# Patient Record
Sex: Female | Born: 1975
Health system: Southern US, Community
[De-identification: ages and names within clinical notes are randomized; demographics above are authoritative.]

---

## 2002-01-23 ENCOUNTER — Emergency Department (HOSPITAL_COMMUNITY): Admission: EM | Admit: 2002-01-23 | Discharge: 2002-01-23 | Payer: Self-pay | Admitting: Emergency Medicine

## 2002-04-29 ENCOUNTER — Other Ambulatory Visit: Admission: RE | Admit: 2002-04-29 | Discharge: 2002-04-29 | Payer: Self-pay | Admitting: *Deleted

## 2002-04-29 ENCOUNTER — Other Ambulatory Visit: Admission: RE | Admit: 2002-04-29 | Discharge: 2002-04-29 | Payer: Self-pay | Admitting: Ophthalmology

## 2002-09-18 ENCOUNTER — Inpatient Hospital Stay (HOSPITAL_COMMUNITY): Admission: AD | Admit: 2002-09-18 | Discharge: 2002-09-22 | Payer: Self-pay | Admitting: *Deleted

## 2002-11-04 ENCOUNTER — Other Ambulatory Visit: Admission: RE | Admit: 2002-11-04 | Discharge: 2002-11-04 | Payer: Self-pay | Admitting: *Deleted

## 2007-03-31 ENCOUNTER — Other Ambulatory Visit: Admission: RE | Admit: 2007-03-31 | Discharge: 2007-03-31 | Payer: Self-pay | Admitting: Family Medicine

## 2010-01-02 ENCOUNTER — Inpatient Hospital Stay (HOSPITAL_COMMUNITY): Admission: AD | Admit: 2010-01-02 | Discharge: 2010-01-05 | Payer: Self-pay | Admitting: Obstetrics and Gynecology

## 2011-01-06 LAB — CBC
HCT: 28.3 % — ABNORMAL LOW (ref 36.0–46.0)
HCT: 35.8 % — ABNORMAL LOW (ref 36.0–46.0)
Hemoglobin: 11.1 g/dL — ABNORMAL LOW (ref 12.0–15.0)
Hemoglobin: 9.1 g/dL — ABNORMAL LOW (ref 12.0–15.0)
MCHC: 31 g/dL (ref 30.0–36.0)
MCHC: 32.1 g/dL (ref 30.0–36.0)
MCV: 71.5 fL — ABNORMAL LOW (ref 78.0–100.0)
MCV: 72.4 fL — ABNORMAL LOW (ref 78.0–100.0)
Platelets: 224 10*3/uL (ref 150–400)
Platelets: 262 10*3/uL (ref 150–400)
RBC: 3.91 MIL/uL (ref 3.87–5.11)
RBC: 5.01 MIL/uL (ref 3.87–5.11)
RDW: 15.7 % — ABNORMAL HIGH (ref 11.5–15.5)
RDW: 15.8 % — ABNORMAL HIGH (ref 11.5–15.5)
WBC: 13.2 10*3/uL — ABNORMAL HIGH (ref 4.0–10.5)
WBC: 8.6 10*3/uL (ref 4.0–10.5)

## 2011-01-06 LAB — RPR: RPR Ser Ql: NONREACTIVE

## 2011-02-12 ENCOUNTER — Inpatient Hospital Stay (HOSPITAL_COMMUNITY): Payer: Self-pay

## 2011-02-12 ENCOUNTER — Inpatient Hospital Stay (HOSPITAL_COMMUNITY)
Admission: AD | Admit: 2011-02-12 | Discharge: 2011-02-12 | Disposition: A | Discharge status: Home / Self Care | Payer: Self-pay | Source: Ambulatory Visit | Attending: Obstetrics and Gynecology | Admitting: Obstetrics and Gynecology

## 2011-02-12 ENCOUNTER — Other Ambulatory Visit: Payer: Self-pay | Admitting: Obstetrics and Gynecology

## 2011-02-12 DIAGNOSIS — O209 Hemorrhage in early pregnancy, unspecified: Secondary | ICD-10-CM | POA: Insufficient documentation

## 2011-02-12 LAB — URINALYSIS, ROUTINE W REFLEX MICROSCOPIC
Bilirubin Urine: NEGATIVE
Glucose, UA: NEGATIVE mg/dL
Ketones, ur: NEGATIVE mg/dL
Leukocytes, UA: NEGATIVE
Nitrite: NEGATIVE
Protein, ur: NEGATIVE mg/dL
Specific Gravity, Urine: 1.02 (ref 1.005–1.030)
Urobilinogen, UA: 0.2 mg/dL (ref 0.0–1.0)
pH: 7.5 (ref 5.0–8.0)

## 2011-02-12 LAB — URINE MICROSCOPIC-ADD ON

## 2011-02-12 LAB — CBC
HCT: 34.5 % — ABNORMAL LOW (ref 36.0–46.0)
Hemoglobin: 10.9 g/dL — ABNORMAL LOW (ref 12.0–15.0)
MCH: 20.6 pg — ABNORMAL LOW (ref 26.0–34.0)
MCHC: 31.6 g/dL (ref 30.0–36.0)
MCV: 65.2 fL — ABNORMAL LOW (ref 78.0–100.0)
Platelets: 322 10*3/uL (ref 150–400)
RBC: 5.29 MIL/uL — ABNORMAL HIGH (ref 3.87–5.11)
RDW: 15.1 % (ref 11.5–15.5)
WBC: 10.4 10*3/uL (ref 4.0–10.5)

## 2011-02-12 LAB — HCG, QUANTITATIVE, PREGNANCY: hCG, Beta Chain, Quant, S: 1528 m[IU]/mL — ABNORMAL HIGH (ref <5)

## 2011-02-12 LAB — ABO/RH: ABO/RH(D): O POS

## 2011-02-12 IMAGING — US US OB COMP LESS 14 WK
1 series · 14 of 28 positions shown · non-contrast
Comparison: none

[Series 1: us ob comp less 14 wks · 61 acquisitions, 14 frames shown]
[im 3/61]
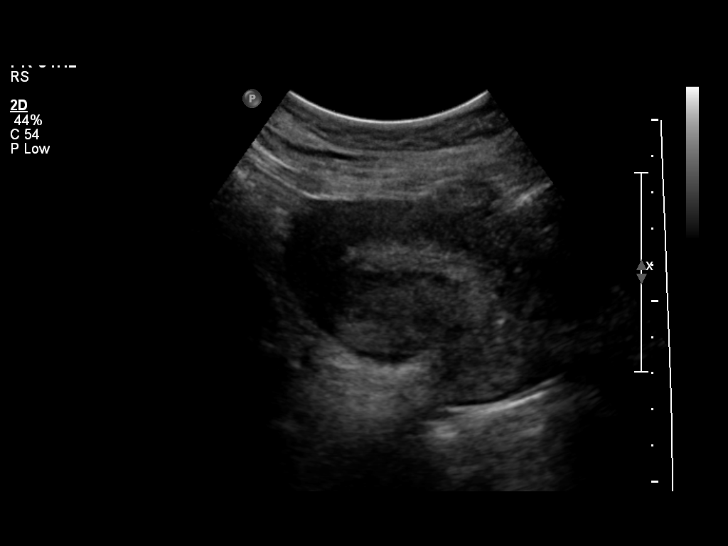
[im 7/61]
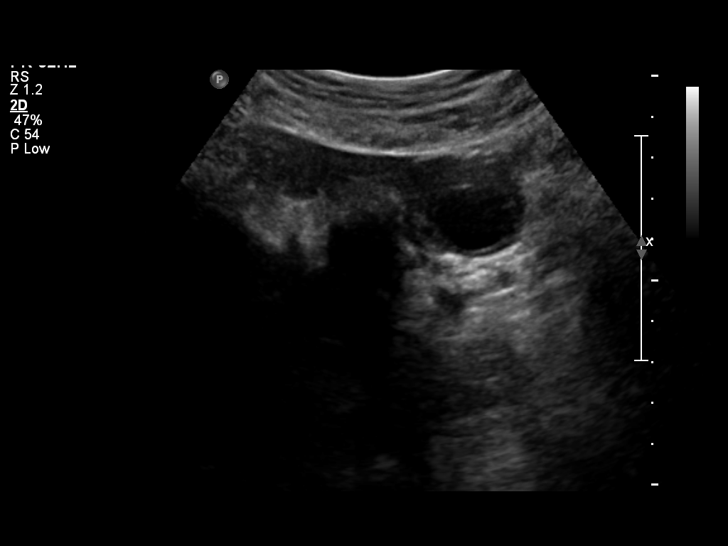
[im 12/61]
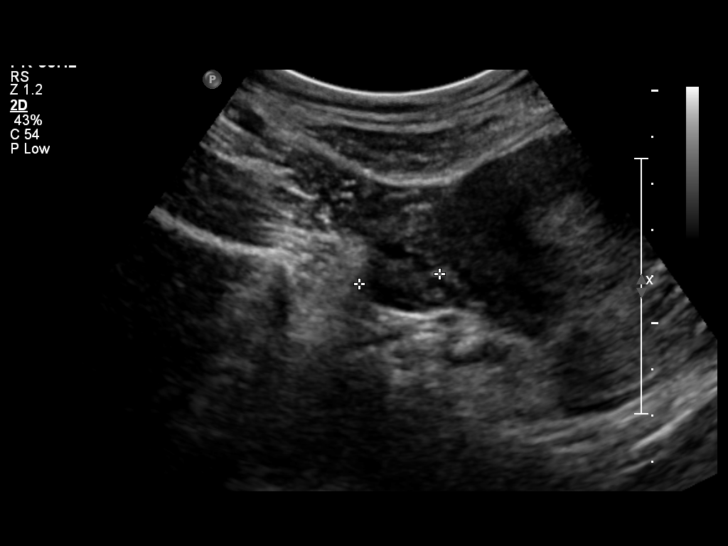
[im 16/61]
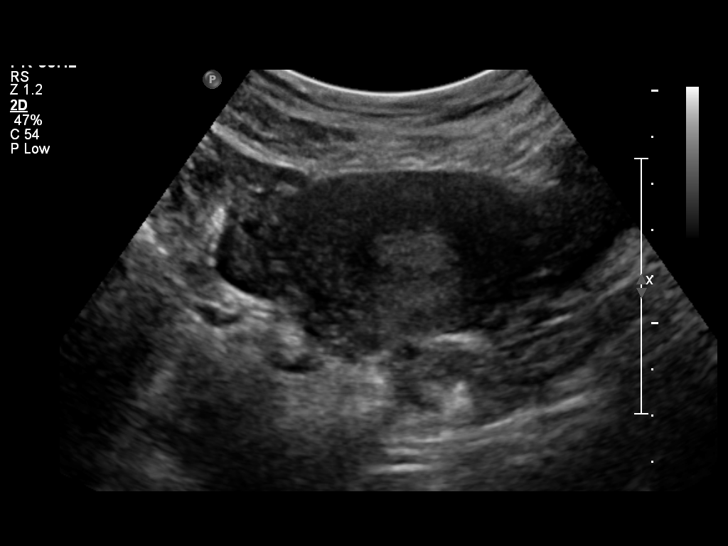
[im 21/61]
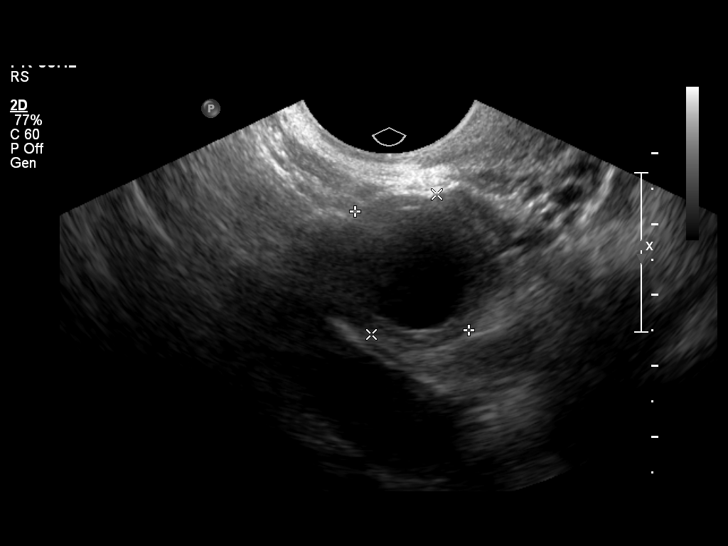
[im 25/61]
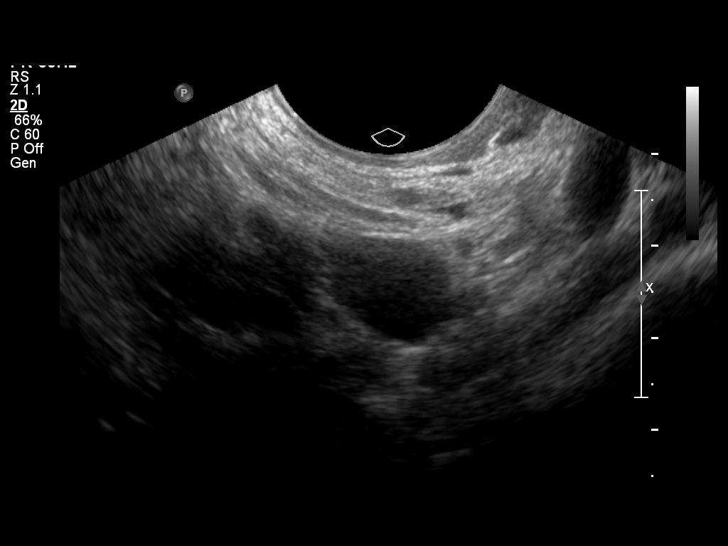
[im 29/61]
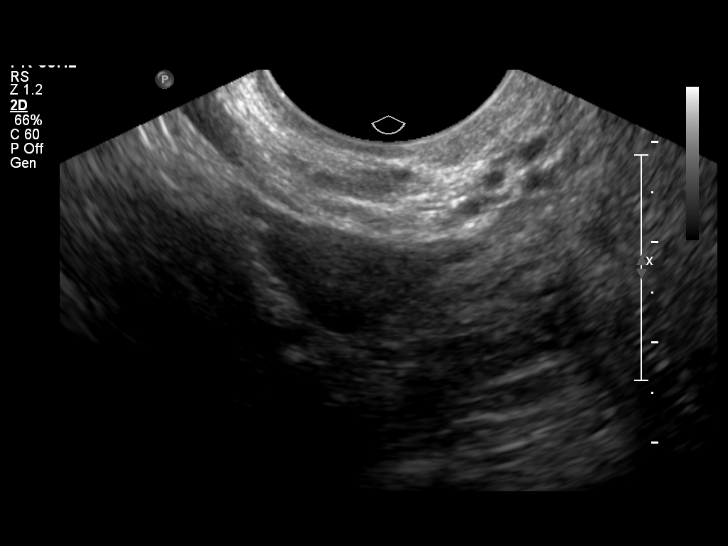
[im 34/61]
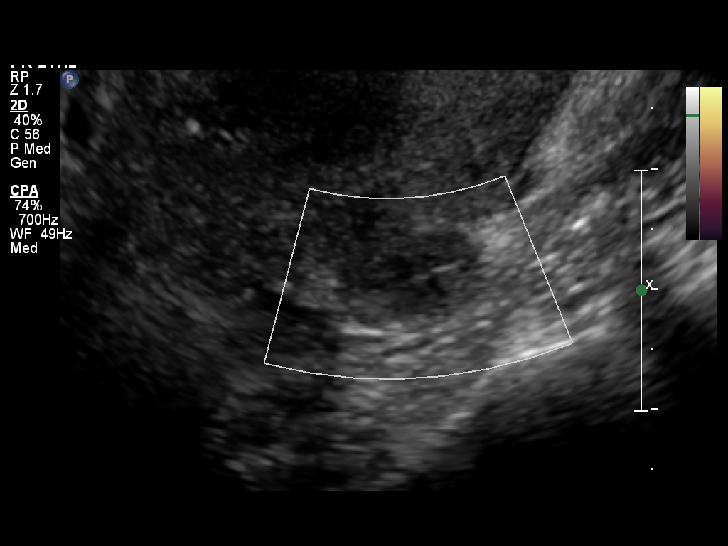
[im 38/61]
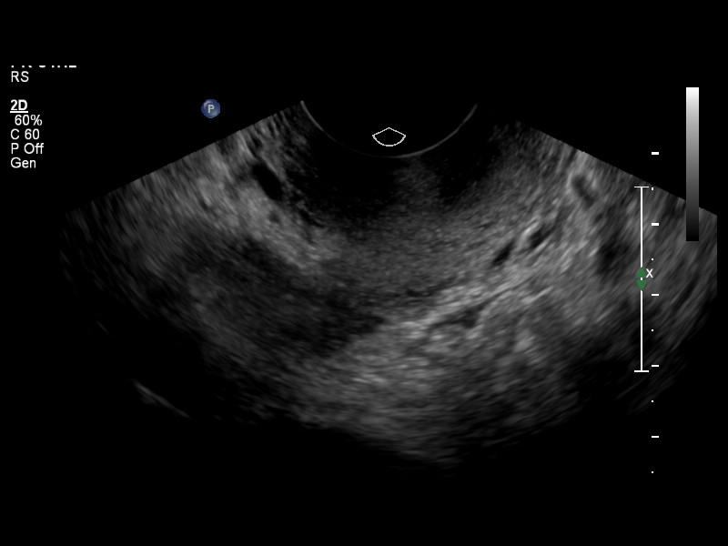
[im 43/61]
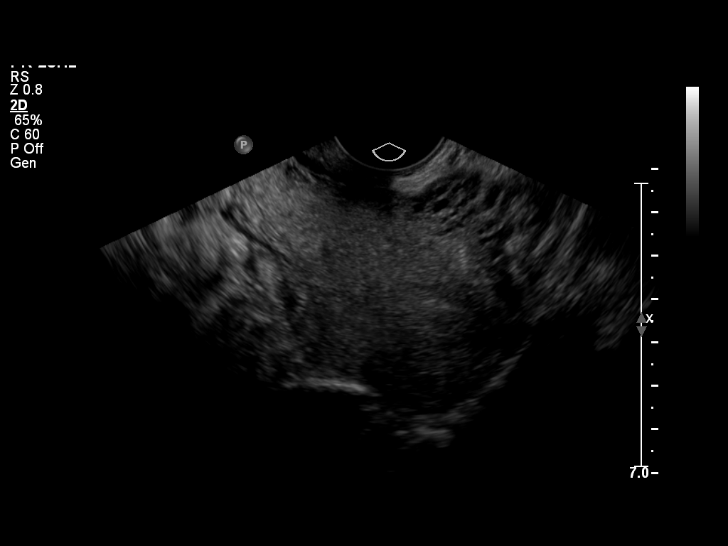
[im 47/61]
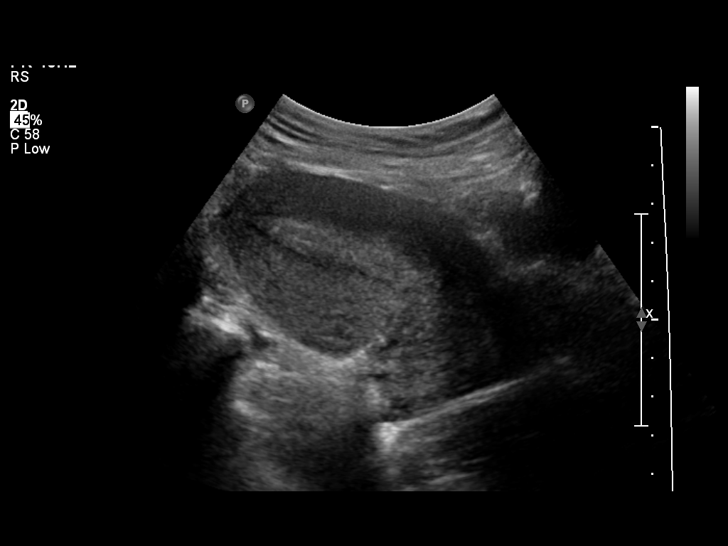
[im 52/61]
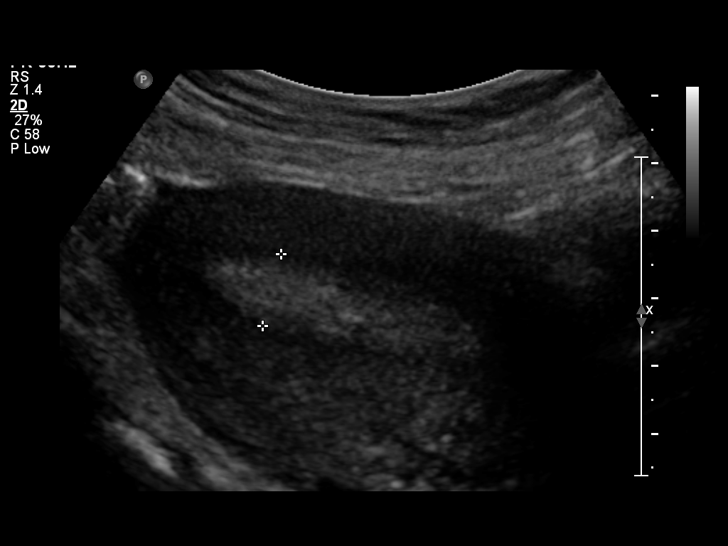
[im 56/61]
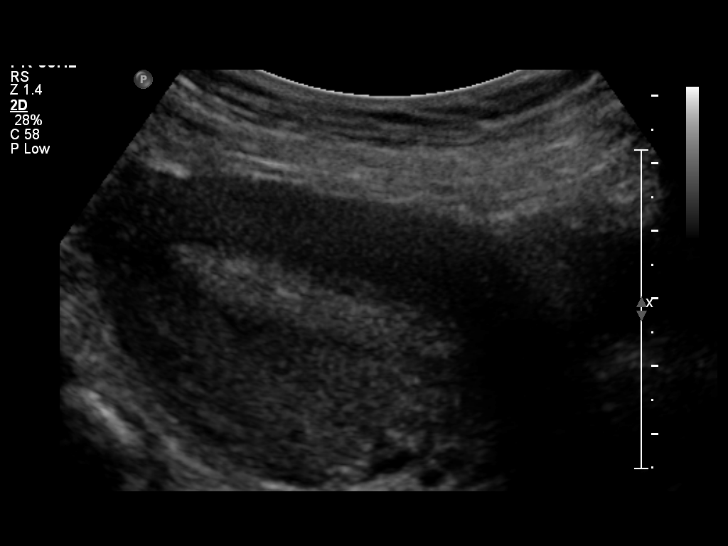
[im 61/61]
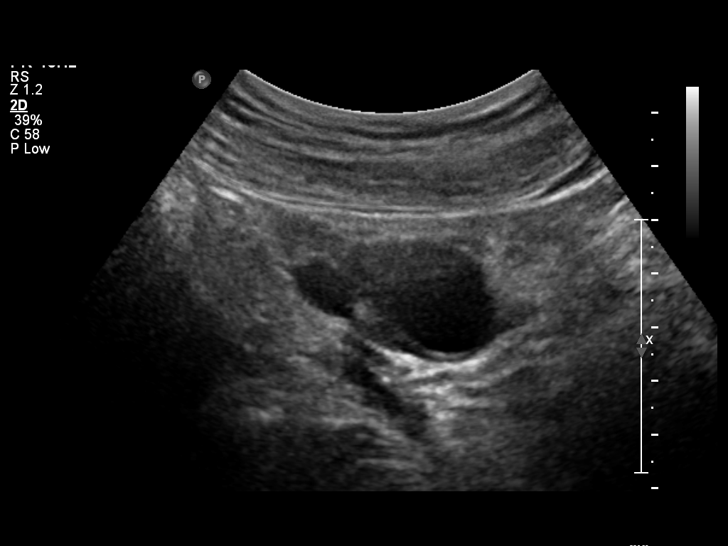

[14 of 28 positions shown; findings below may reference images not displayed]

OBSTETRICS REPORT

                 26_E
Procedures

 US OB COMP LESS 14 WKS                                76801.0
 US OB TRANSVAGINAL                                    76817.0
Indications

 Vaginal bleeding, unknown etiology
 Previous cesarean section
Fetal Evaluation

 Gest. Sac:         None seen
 Yolk Sac:          Not visualized
 Fetal Pole:        Not visualized
 Cardiac Activity:  No embryo visualized
Gestational Age

 LMP:           6w 0d         Date:  [DATE]                 EDD:   [DATE]
 Best:          6w 0d      Det. By:  LMP  ([DATE])          EDD:   [DATE]
Cervix Uterus Adnexa

 Cul De Sac:   11.8 x 9.0 x 10.3 mm heterogeneous mass.
 Left Ovary:    Within normal limits.
 Right Ovary:   Within normal limits.

 Adnexa:     No free fluid.
Impression

 No IUP or free fluid seen.  1.2cm heterogeneous mass in
 pelvic cul-de-sac, suspicious for ectopic pregnancy.

 Critical results were called by telephone on [DATE] at [YW]
 hrs to KLEAVEN, NP in KLEAVEN , who verbally acknowledged these
 results.

 questions or concerns.
                 Attending Physician, KLEAVEN

## 2011-03-01 NOTE — Op Note (Signed)
NAME:  Savannah White, Savannah White NO.:  [id number]   MEDICAL RECORD NO.:  [id number]                   PATIENT TYPE:  INP   LOCATION:  9104                                 FACILITY:  [facility]   PHYSICIAN:  Dineen Kid. Rana Snare, M.D.                 DATE OF BIRTH:  Feb 22, 1976   DATE OF PROCEDURE:  09/19/2002  DATE OF DISCHARGE:                                 OPERATIVE REPORT   PREOPERATIVE DIAGNOSES:  Intrauterine pregnancy at 38 weeks.  Failure to  progress.   POSTOPERATIVE DIAGNOSES:  Intrauterine pregnancy at 38 weeks. Failure to  progress.  Occiput-posterior presentation.   PROCEDURE:  Primary low segment transverse cesarean section.   SURGEON:  Dineen Kid. Rana Snare, M.D.   ANESTHESIA:  Epidural.   ESTIMATED BLOOD LOSS:  800 cc.   INDICATIONS FOR PROCEDURE:  The patient is a 35 year old G1, who presented  in labor at 38 weeks.  She had a very protracted labor and progressed to 8-9  cm.  Despite adequate labor on Pitocin, she failed to progress beyond 9 cm.  It was decided to proceed with primary low segment transverse cesarean  section for failure to progress and persistent occiput-posterior  presentation.  The risks and benefits were discussed, and a full consent was  obtained.   FINDINGS:  The findings at the time of surgery were a female infant in the  occiput-posterior presentation.  Apgars were 8 and 9.  The pH, arterial, was  7.32.  Weight was 7 lb, 3 oz.   DESCRIPTION OF PROCEDURE:  After adequate analgesia, the patient was placed  in the supine position with left lateral tilt.  She was sterilely prepped  and draped.  The bladder was thoroughly drained with a Foley catheter.  A  Pfannenstiel skin incision was made two fingerbreadths above the pubic  symphysis and was taken down sharply to the fascia, which was incised  transversely and extended superiorly and inferiorly off the belly of the  rectus muscle, which was separated sharply in the midline.  The  peritoneum  was entered sharply.  The bladder blade was placed.  The uterine serosa was  elevated and incised transversely.  A bladder flap was created and placed  behind the bladder blade.  A low incision was made down to the infant's  vertex and extended laterally.  The infant's vertex was then delivered  atraumatically.  The nasopharynx was suctioned.  The infant was then  delivered. The cord was clamped and cut, and the infant was handed to the  pediatrician for resuscitation.  Cord blood was obtained.  The placenta was  extracted manually.  The uterus was exteriorized and wiped clean with a dry  lap.  The incision was closed in two layers, the first being a running  locking layer and the second being an imbricating layer of 0 Monocryl  suture.  The uterus was placed back into the peritoneal cavity, and after a  copious amount of irrigation, adequate hemostasis was assured.  The  peritoneum was closed with 0 Monocryl in a running fashion.  The rectus  muscle was plicated in the midline.  The uterus was ____________, and after  adequate hemostasis, the fascia was closed with a #1 Vicryl in a running  fashion.  ______________ was applied and after adequate hemostasis and the  skin with staples, and Steri-  Strips were applied.  The patient tolerated the procedure well and was  stable and was transferred to the recovery room.  Sponge, needle, and  instrument counts were correct x3.  Estimated blood loss was 800 cc.  The  patient received 1 gm of Mefoxin after delivery of the placenta.                                               Dineen Kid Rana Snare, M.D.    DCL/MEDQ  D:  09/19/2002  T:  09/19/2002  Job:  [id number]

## 2011-03-01 NOTE — Discharge Summary (Signed)
NAME:  HOLIDAY, MCMENAMIN NO.:  [id number]   MEDICAL RECORD NO.:  [id number]                   PATIENT TYPE:  INP   LOCATION:  9104                                 [facility]:  [facility]   PHYSICIAN:  Duke Salvia. Marcelle Overlie, M.D.            DATE OF BIRTH:  10-05-1976   DATE OF ADMISSION:  09/18/2002  DATE OF DISCHARGE:  09/22/2002                                 DISCHARGE SUMMARY   ADMISSION DIAGNOSES:  1. Intrauterine pregnancy at 62 weeks estimated gestational age.  2. Failure to progress.   DISCHARGE DIAGNOSES:  1. Status post low transverse Cesarean section.  2. Viable female infant.   PROCEDURE:  Primary low transverse Cesarean section.   REASON FOR ADMISSION:  Please see written history and physical note.   HOSPITAL COURSE:  The patient is a 35 year old gravida 1 who presented to  [facility] in labor at 24 weeks estimated gestational age.  The patient did progress to 8 to 9 cm dilated.  Despite adequate labor on  Pitocin, she failed to progress beyond 9 cm.  Decision was made to proceed  with a Cesarean section delivery.   The patient was taken to the operating room where epidural anesthesia was  adequately dosed to a surgical level.  A low transverse incision was made  with the delivery of a viable female infant who weighed 7 pounds 3 ounces with  Apgar's of 8 at 1 minute and 9 at 5 minutes.  Arterial cord pH was 7.32.  The patient tolerated the procedure well and was taken to the recovery room  in stable condition.   On postoperative day #1 the patient had good return of bowel function.  Abdomen was soft.  Abdominal dressing was partially removed revealing an  incision that was clean, dry and intact.   LABORATORY DATA:  Hemoglobin 8.1, platelet count 208,000, white blood cell  count 13.3.   Iron supplementation was started.  On postoperative day #2 the abdomen was  soft. Incision was clean, dry and intact.  Staples were intact.  The  patient  desired early discharge.  However, infant was kept over due to elevated  bilirubin and the patient decided to stay an additional night.  On  postoperative day #3 the patient was doing well.  She was ambulating without  assistance.  She was tolerating a regular diet without complaints of nausea  and vomiting.  Incision was clean, dry and intact.  Staples were removed and  the patient was discharged home.   CONDITION ON DISCHARGE:  Good.   DIET:  Regular as tolerated.   ACTIVITY:  No heavy lifting.  No driving for two weeks.  No vaginal entry.   FOLLOW UP:  The patient is to follow up in the office in one week for an  incision check.  She is to call for temperature greater than 100  degrees,  persistent nausea and vomiting, heavy vaginal bleeding and/or redness or  drainage from the incisional site.   DISCHARGE MEDICATIONS:  1. Percocet 5/325 mg #30, one p.o. q.4-6h. PRN pain.  2. Motrin 600 mg q.6h.  3. Niferex 150 mg one p.o. daily.  4. Prenatal vitamins, one p.o. daily.  5. Colace one p.o. daily, PRN.     Julio Sicks, N.P.                        Richard M. Marcelle Overlie, M.D.    CC/MEDQ  D:  10/28/2002  T:  10/28/2002  Job:  [id number]

## 2013-05-24 ENCOUNTER — Other Ambulatory Visit: Payer: Self-pay | Admitting: Dermatology

## 2014-06-16 ENCOUNTER — Other Ambulatory Visit: Payer: Self-pay | Admitting: Obstetrics and Gynecology

## 2014-06-17 LAB — CYTOLOGY - PAP

## 2015-02-27 ENCOUNTER — Other Ambulatory Visit: Payer: Self-pay | Admitting: Dermatology

## 2015-07-12 ENCOUNTER — Other Ambulatory Visit: Payer: Self-pay | Admitting: Obstetrics and Gynecology

## 2015-07-13 LAB — CYTOLOGY - PAP

## 2016-01-11 ENCOUNTER — Ambulatory Visit (INDEPENDENT_AMBULATORY_CARE_PROVIDER_SITE_OTHER)
Admission: RE | Admit: 2016-01-11 | Discharge: 2016-01-11 | Disposition: A | Discharge status: Home / Self Care | Payer: BLUE CROSS/BLUE SHIELD | Source: Ambulatory Visit | Attending: Internal Medicine | Admitting: Internal Medicine

## 2016-01-11 ENCOUNTER — Ambulatory Visit (INDEPENDENT_AMBULATORY_CARE_PROVIDER_SITE_OTHER): Payer: BLUE CROSS/BLUE SHIELD | Admitting: Internal Medicine

## 2016-01-11 ENCOUNTER — Encounter: Payer: Self-pay | Admitting: Internal Medicine

## 2016-01-11 VITALS — BP 100/60 | HR 66 | Temp 98.2°F | Resp 12 | Ht 61.0 in | Wt 117.0 lb

## 2016-01-11 DIAGNOSIS — R0781 Pleurodynia: Secondary | ICD-10-CM

## 2016-01-11 NOTE — Progress Notes (Signed)
Pre visit review using our clinic review tool, if applicable. No additional management support is needed unless otherwise documented below in the visit note. 

## 2016-01-11 NOTE — Patient Instructions (Addendum)
We will check the chest x-ray today and call you back with the results.   We will get the records from the other doctor to check on the blood results.     Health Maintenance, Female Adopting a healthy lifestyle and getting preventive care can go a long way to promote health and wellness. Talk with your health care provider about what schedule of regular examinations is right for you. This is a good chance for you to check in with your provider about disease prevention and staying healthy. In between checkups, there are plenty of things you can do on your own. Experts have done a lot of research about which lifestyle changes and preventive measures are most likely to keep you healthy. Ask your health care provider for more information. WEIGHT AND DIET  Eat a healthy diet  Be sure to include plenty of vegetables, fruits, low-fat dairy products, and lean protein.  Do not eat a lot of foods high in solid fats, added sugars, or salt.  Get regular exercise. This is one of the most important things you can do for your health.  Most adults should exercise for at least 150 minutes each week. The exercise should increase your heart rate and make you sweat (moderate-intensity exercise).  Most adults should also do strengthening exercises at least twice a week. This is in addition to the moderate-intensity exercise.  Maintain a healthy weight  Body mass index (BMI) is a measurement that can be used to identify possible weight problems. It estimates body fat based on height and weight. Your health care provider can help determine your BMI and help you achieve or maintain a healthy weight.  For females 28 years of age and older:   A BMI below 18.5 is considered underweight.  A BMI of 18.5 to 24.9 is normal.  A BMI of 25 to 29.9 is considered overweight.  A BMI of 30 and above is considered obese.  Watch levels of cholesterol and blood lipids  You should start having your blood tested for  lipids and cholesterol at 38 years of age, then have this test every 5 years.  You may need to have your cholesterol levels checked more often if:  Your lipid or cholesterol levels are high.  You are older than 38 years of age.  You are at high risk for heart disease.  CANCER SCREENING   Lung Cancer  Lung cancer screening is recommended for adults 79-42 years old who are at high risk for lung cancer because of a history of smoking.  A yearly low-dose CT scan of the lungs is recommended for people who:  Currently smoke.  Have quit within the past 15 years.  Have at least a 30-pack-year history of smoking. A pack year is smoking an average of one pack of cigarettes a day for 1 year.  Yearly screening should continue until it has been 15 years since you quit.  Yearly screening should stop if you develop a health problem that would prevent you from having lung cancer treatment.  Breast Cancer  Practice breast self-awareness. This means understanding how your breasts normally appear and feel.  It also means doing regular breast self-exams. Let your health care provider know about any changes, no matter how small.  If you are in your 20s or 30s, you should have a clinical breast exam (CBE) by a health care provider every 1-3 years as part of a regular health exam.  If you are 40 or older, have  a CBE every year. Also consider having a breast X-ray (mammogram) every year.  If you have a family history of breast cancer, talk to your health care provider about genetic screening.  If you are at high risk for breast cancer, talk to your health care provider about having an MRI and a mammogram every year.  Breast cancer gene (BRCA) assessment is recommended for women who have family members with BRCA-related cancers. BRCA-related cancers include:  Breast.  Ovarian.  Tubal.  Peritoneal cancers.  Results of the assessment will determine the need for genetic counseling and BRCA1  and BRCA2 testing. Cervical Cancer Your health care provider may recommend that you be screened regularly for cancer of the pelvic organs (ovaries, uterus, and vagina). This screening involves a pelvic examination, including checking for microscopic changes to the surface of your cervix (Pap test). You may be encouraged to have this screening done every 3 years, beginning at age 13.  For women ages 66-65, health care providers may recommend pelvic exams and Pap testing every 3 years, or they may recommend the Pap and pelvic exam, combined with testing for human papilloma virus (HPV), every 5 years. Some types of HPV increase your risk of cervical cancer. Testing for HPV may also be done on women of any age with unclear Pap test results.  Other health care providers may not recommend any screening for nonpregnant women who are considered low risk for pelvic cancer and who do not have symptoms. Ask your health care provider if a screening pelvic exam is right for you.  If you have had past treatment for cervical cancer or a condition that could lead to cancer, you need Pap tests and screening for cancer for at least 20 years after your treatment. If Pap tests have been discontinued, your risk factors (such as having a new sexual partner) need to be reassessed to determine if screening should resume. Some women have medical problems that increase the chance of getting cervical cancer. In these cases, your health care provider may recommend more frequent screening and Pap tests. Colorectal Cancer  This type of cancer can be detected and often prevented.  Routine colorectal cancer screening usually begins at 38 years of age and continues through 38 years of age.  Your health care provider may recommend screening at an earlier age if you have risk factors for colon cancer.  Your health care provider may also recommend using home test kits to check for hidden blood in the stool.  A small camera at the  end of a tube can be used to examine your colon directly (sigmoidoscopy or colonoscopy). This is done to check for the earliest forms of colorectal cancer.  Routine screening usually begins at age 48.  Direct examination of the colon should be repeated every 5-10 years through 38 years of age. However, you may need to be screened more often if early forms of precancerous polyps or small growths are found. Skin Cancer  Check your skin from head to toe regularly.  Tell your health care provider about any new moles or changes in moles, especially if there is a change in a mole's shape or color.  Also tell your health care provider if you have a mole that is larger than the size of a pencil eraser.  Always use sunscreen. Apply sunscreen liberally and repeatedly throughout the day.  Protect yourself by wearing long sleeves, pants, a wide-brimmed hat, and sunglasses whenever you are outside. HEART DISEASE, DIABETES, AND HIGH  BLOOD PRESSURE   High blood pressure causes heart disease and increases the risk of stroke. High blood pressure is more likely to develop in:  People who have blood pressure in the high end of the normal range (130-139/85-89 mm Hg).  People who are overweight or obese.  People who are African American.  If you are 18-39 years of age, have your blood pressure checked every 3-5 years. If you are 40 years of age or older, have your blood pressure checked every year. You should have your blood pressure measured twice--once when you are at a hospital or clinic, and once when you are not at a hospital or clinic. Record the average of the two measurements. To check your blood pressure when you are not at a hospital or clinic, you can use:  An automated blood pressure machine at a pharmacy.  A home blood pressure monitor.  If you are between 55 years and 79 years old, ask your health care provider if you should take aspirin to prevent strokes.  Have regular diabetes  screenings. This involves taking a blood sample to check your fasting blood sugar level.  If you are at a normal weight and have a low risk for diabetes, have this test once every three years after 38 years of age.  If you are overweight and have a high risk for diabetes, consider being tested at a younger age or more often. PREVENTING INFECTION  Hepatitis B  If you have a higher risk for hepatitis B, you should be screened for this virus. You are considered at high risk for hepatitis B if:  You were born in a country where hepatitis B is common. Ask your health care provider which countries are considered high risk.  Your parents were born in a high-risk country, and you have not been immunized against hepatitis B (hepatitis B vaccine).  You have HIV or AIDS.  You use needles to inject street drugs.  You live with someone who has hepatitis B.  You have had sex with someone who has hepatitis B.  You get hemodialysis treatment.  You take certain medicines for conditions, including cancer, organ transplantation, and autoimmune conditions. Hepatitis C  Blood testing is recommended for:  Everyone born from 1945 through 1965.  Anyone with known risk factors for hepatitis C. Sexually transmitted infections (STIs)  You should be screened for sexually transmitted infections (STIs) including gonorrhea and chlamydia if:  You are sexually active and are younger than 38 years of age.  You are older than 38 years of age and your health care provider tells you that you are at risk for this type of infection.  Your sexual activity has changed since you were last screened and you are at an increased risk for chlamydia or gonorrhea. Ask your health care provider if you are at risk.  If you do not have HIV, but are at risk, it may be recommended that you take a prescription medicine daily to prevent HIV infection. This is called pre-exposure prophylaxis (PrEP). You are considered at risk  if:  You are sexually active and do not regularly use condoms or know the HIV status of your partner(s).  You take drugs by injection.  You are sexually active with a partner who has HIV. Talk with your health care provider about whether you are at high risk of being infected with HIV. If you choose to begin PrEP, you should first be tested for HIV. You should then be tested every   3 months for as long as you are taking PrEP.  PREGNANCY   If you are premenopausal and you may become pregnant, ask your health care provider about preconception counseling.  If you may become pregnant, take 400 to 800 micrograms (mcg) of folic acid every day.  If you want to prevent pregnancy, talk to your health care provider about birth control (contraception). OSTEOPOROSIS AND MENOPAUSE   Osteoporosis is a disease in which the bones lose minerals and strength with aging. This can result in serious bone fractures. Your risk for osteoporosis can be identified using a bone density scan.  If you are 55 years of age or older, or if you are at risk for osteoporosis and fractures, ask your health care provider if you should be screened.  Ask your health care provider whether you should take a calcium or vitamin D supplement to lower your risk for osteoporosis.  Menopause may have certain physical symptoms and risks.  Hormone replacement therapy may reduce some of these symptoms and risks. Talk to your health care provider about whether hormone replacement therapy is right for you.  HOME CARE INSTRUCTIONS   Schedule regular health, dental, and eye exams.  Stay current with your immunizations.   Do not use any tobacco products including cigarettes, chewing tobacco, or electronic cigarettes.  If you are pregnant, do not drink alcohol.  If you are breastfeeding, limit how much and how often you drink alcohol.  Limit alcohol intake to no more than 1 drink per day for nonpregnant women. One drink equals 12  ounces of beer, 5 ounces of wine, or 1 ounces of hard liquor.  Do not use street drugs.  Do not share needles.  Ask your health care provider for help if you need support or information about quitting drugs.  Tell your health care provider if you often feel depressed.  Tell your health care provider if you have ever been abused or do not feel safe at home.   This information is not intended to replace advice given to you by your health care provider. Make sure you discuss any questions you have with your health care provider.   Document Released: 04/15/2011 Document Revised: 10/21/2014 Document Reviewed: 09/01/2013 Elsevier Interactive Patient Education [employer].

## 2016-01-13 DIAGNOSIS — R0781 Pleurodynia: Secondary | ICD-10-CM | POA: Insufficient documentation

## 2016-01-13 NOTE — Assessment & Plan Note (Signed)
Check chest x-ray and some basic labs. [place] and did receive some immunizations and presumed TB screening upon entering the country which is several years ago now. Description matches a small ventral hernia and we discussed what makes them worse and to avoid straining and vigorous abdominal exercise.

## 2016-01-13 NOTE — Progress Notes (Signed)
   Subjective:    Patient ID: Savannah White, female    DOB: 08-06-1976, 40 y.o.   MRN: [id number]  HPI The patient is a 40 YO female coming in new for some pain below her right rib. She states that when she coughs or strains she gets a bulge there. It is painful when it bulges but does not hurt otherwise. No nausea or vomiting. Does not worsen when she eats. No diarrhea or constipation. Takes no medicines. Pain lasts 5-10 minutes.   [facility] reviewed and updated.   Review of Systems  Constitutional: Negative for fever, activity change, appetite change, fatigue and unexpected weight change.  HENT: Negative.   Eyes: Negative.   Respiratory: Negative for cough, chest tightness, shortness of breath and wheezing.   Cardiovascular: Negative for chest pain, palpitations and leg swelling.  Gastrointestinal: Positive for abdominal pain. Negative for nausea, vomiting, diarrhea, constipation and abdominal distention.  Musculoskeletal: Positive for myalgias. Negative for back pain and arthralgias.  Neurological: Negative for dizziness, seizures, syncope, weakness, light-headedness and headaches.  Psychiatric/Behavioral: Negative.       Objective:   Physical Exam  Constitutional: She is oriented to person, place, and time. She appears well-developed and well-nourished.  HENT:  Head: Normocephalic and atraumatic.  Eyes: EOM are normal.  Neck: Normal range of motion.  Cardiovascular: Normal rate and regular rhythm.   Pulmonary/Chest: Effort normal and breath sounds normal. No respiratory distress. She has no wheezes. She has no rales.  Abdominal: Soft. Bowel sounds are normal. She exhibits no distension. There is no tenderness. There is no rebound.  No hernia detected with cough   Musculoskeletal: She exhibits no edema.  Neurological: She is alert and oriented to person, place, and time. Coordination normal.  Skin: Skin is warm and dry.  Psychiatric: She has a normal mood and affect.    Filed Vitals:   01/11/16 1403  BP: 100/60  Pulse: 66  Temp: 98.2 F (36.8 C)  TempSrc: Oral  Resp: 12  Height: 5\' 1"  (1.549 m)  Weight: 117 lb (53.071 kg)  SpO2: 98%      Assessment & Plan:

## 2017-01-06 ENCOUNTER — Encounter: Payer: Self-pay | Admitting: Neurology

## 2017-01-06 ENCOUNTER — Ambulatory Visit (INDEPENDENT_AMBULATORY_CARE_PROVIDER_SITE_OTHER): Payer: BLUE CROSS/BLUE SHIELD | Admitting: Neurology

## 2017-01-06 VITALS — BP 108/62 | HR 68 | Ht 60.0 in | Wt 120.6 lb

## 2017-01-06 DIAGNOSIS — G43009 Migraine without aura, not intractable, without status migrainosus: Secondary | ICD-10-CM

## 2017-01-06 MED ORDER — SUMATRIPTAN SUCCINATE 100 MG PO TABS: ORAL_TABLET | ORAL | 2 refills | Status: DC

## 2017-01-06 NOTE — Patient Instructions (Signed)
Migraine Recommendations: 1.  Try to treat the allergies, as it is likely a cause of the headaches. 2.  Take sumatriptan 100mg  at earliest onset of headache.  May repeat dose once in 2 hours if needed.  Do not exceed two tablets in 24 hours. 3.  Limit use of pain relievers to no more than 2 days out of the week.  These medications include acetaminophen, ibuprofen, triptans and narcotics.  This will help reduce risk of rebound headaches. 4.  Be aware of common food triggers such as processed sweets, processed foods with nitrites (such as deli meat, hot dogs, sausages), foods with MSG, alcohol (such as wine), chocolate, certain cheeses, certain fruits (dried fruits, some citrus fruit), vinegar, diet soda. 4.  Avoid caffeine 5.  Routine exercise 6.  Proper sleep hygiene 7.  Stay adequately hydrated with water 8.  Keep a headache diary. 9.  Maintain proper stress management. 10.  Do not skip meals. 11.        Take Magnesium citrate 400mg  to 600mg  daily, riboflavin 400mg , Coenzyme Q10 100mg  three times daily 12.  Follow up in 4 months.

## 2017-01-06 NOTE — Progress Notes (Signed)
NEUROLOGY CONSULTATION NOTE  Savannah White MRN: [id number] DOB: Feb 15, 1976  Referring provider: Dr. Paulino Rily Primary care provider: Dr. Paulino Rily  Reason for consult:  migraines  HISTORY OF PRESENT ILLNESS: Savannah White is a 41 year old female who presents for headache.  History supplemented by PCP note.  Onset:  Several years ago Location:  Unilateral, either side (involving head, face, periorbital) Quality:  Pressure.  Head feels tender afterwards. Intensity:  8/10 Aura:  no Prodrome:  No Associated symptoms:  No nausea, vomiting, photophobia, phonophobia or autonomic symptoms..  She has not had any new worse headache of her life, waking up from sleep Duration:  1 day Frequency:  Varies.  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) Relieving factors:  sleep Activity:  Aggravates but able to function  Past NSAIDS:  no Past analgesics:  no Past abortive triptans:  no Past muscle relaxants:  no Past anti-emetic:  no Past antihypertensive medications:  no Past antidepressant medications:  no Past anticonvulsant medications:  no Past vitamins/Herbal/Supplements:  nono Other past therapies:  no  Current NSAIDS:  ibuprofen Current analgesics:  no Current triptans:  no Current anti-emetic:  no Current muscle relaxants:  no Current anti-anxiolytic:  no Current sleep aide:  no Current Antihypertensive medications:  no Current Antidepressant medications:  no Current Anticonvulsant medications:  no Current Vitamins/Herbal/Supplements:  no Current Antihistamines/Decongestants:  diphenhydramine 10mg  Other therapy:  no Birth Control:  IUD (placed 2016 but headaches precede this)  Caffeine:  no Alcohol:  no Smoker:  no Diet:   Needs to increase water intake Exercise:  Not routine Depression/anxiety:  no Sleep hygiene:  good Family history of headache:  no  PAST MEDICAL HISTORY: No past medical history on file.  PAST SURGICAL HISTORY: No past surgical history on file.  MEDICATIONS: No current outpatient prescriptions on file prior to visit.   No current facility-administered medications on file prior to visit.     ALLERGIES: No Known Allergies  FAMILY HISTORY: Family History  Problem Relation Age of Onset  . Family history unknown: Yes    SOCIAL HISTORY: Social History   Social History  . Marital status: Married    Spouse name: N/A  . Number of children: N/A  . Years of education: N/A   Occupational History  . Not on file.   Social History Main Topics  . Smoking status: Never Smoker  . Smokeless tobacco: Never Used  . Alcohol use No  . Drug use: No  . Sexual activity: Not on file   Other Topics Concern  . Not on file   Social History Narrative  . No narrative on file    REVIEW OF SYSTEMS: Constitutional: No fevers, chills, or sweats, no generalized fatigue, change in appetite Eyes: No visual changes, double vision, eye pain Ear, nose and throat: runny nose Cardiovascular: No chest pain, palpitations Respiratory:  No shortness of breath at rest or with exertion, wheezes GastrointestinaI: No nausea, vomiting, diarrhea, abdominal pain, fecal incontinence Genitourinary:  No dysuria, urinary retention or frequency Musculoskeletal:  No neck pain, back pain Integumentary: No rash, pruritus, skin lesions Neurological: as above Psychiatric: No depression, insomnia, anxiety Endocrine: No palpitations, fatigue, diaphoresis, mood swings, change in appetite, change in weight, increased thirst Hematologic/Lymphatic:  No purpura, petechiae.  Allergic/Immunologic: no itchy/runny eyes, nasal congestion, recent allergic reactions, rashes  PHYSICAL EXAM: Vitals:   01/06/17 0807  BP:  108/62  Pulse: 68   General: No acute distress.  Patient appears well-groomed.  Head:  Normocephalic/atraumatic Eyes:  fundi examined but not visualized Neck: supple, no paraspinal tenderness, full range of motion Back: No paraspinal tenderness Heart: regular rate and rhythm Lungs: Clear to auscultation bilaterally. Vascular: No carotid bruits. Neurological Exam: Mental status: alert and oriented to person, place, and time, recent and remote memory intact, fund of knowledge intact, attention and concentration intact, speech fluent and not dysarthric, language intact. Cranial nerves: CN I: not tested CN II: pupils equal, round and reactive to light, visual fields intact CN III, IV, VI:  full range of motion, no nystagmus, no ptosis CN V: facial sensation intact CN VII: upper and lower face symmetric CN VIII: hearing intact CN IX, X: gag intact, uvula midline CN XI: sternocleidomastoid and trapezius muscles intact CN XII: tongue midline Bulk & Tone: normal, no fasciculations. Motor:  5/5 throughout  Sensation: temperature and vibration sensation intact. Deep Tendon Reflexes:  2+ throughout, toes downgoing.  Finger to nose testing:  Without dysmetria.  Heel to shin:  Without dysmetria.  Gait:  Normal station and stride.  Able to turn and tandem walk. Romberg negative.  IMPRESSION: Migraine without aura  PLAN: 1.  As the headaches are not chronic and are only more frequent intermittently, we will hold off on starting a preventative for now.  She will focus on lifestyle modification first. 2.  Will prescribe sumatriptan 100mg  for abortive therapy. 3.  Try to appropriately treat allergies, as they are a trigger. 4.  Increase exercise and water intake 5.  Supplements:  Magnesium, riboflavin, coenzyme Q10 6.  Follow up in 4 months.  Thank you for allowing me to take part in the care of this patient.  Shon MilletAdam Luvada Salamone, DO  CC:  Mila PalmerSharon Wolters, MD

## 2017-05-08 ENCOUNTER — Ambulatory Visit: Payer: BLUE CROSS/BLUE SHIELD | Admitting: Neurology

## 2017-06-13 ENCOUNTER — Ambulatory Visit (INDEPENDENT_AMBULATORY_CARE_PROVIDER_SITE_OTHER): Payer: BLUE CROSS/BLUE SHIELD | Admitting: Neurology

## 2017-06-13 ENCOUNTER — Encounter: Payer: Self-pay | Admitting: Neurology

## 2017-06-13 VITALS — BP 98/64 | HR 64 | Ht 60.5 in | Wt 117.6 lb

## 2017-06-13 DIAGNOSIS — G43009 Migraine without aura, not intractable, without status migrainosus: Secondary | ICD-10-CM

## 2017-06-13 MED ORDER — TOPIRAMATE 50 MG PO TABS: 50.0000 mg | ORAL_TABLET | Freq: Every day | ORAL | 2 refills | Status: AC

## 2017-06-13 NOTE — Patient Instructions (Signed)
1.  We will start topiramate (Topamax) 50mg  at bedtime.  Contact me in 4 weeks (prior to refill) with update to determine if we need to increase dose.  Possible side effects include: impaired thinking, sedation, paresthesias (numbness and tingling) and weight loss.  It may cause dehydration and there is a small risk for kidney stones, so make sure to stay hydrated with water during the day.  There is also a very small risk for glaucoma, so if you notice any change in your vision while taking this medication, see an ophthalmologist.     2.  Stop sumatriptan.  Limit use of ibuprofen to no more than 2 days out of the week to prevent daily rebound headache. 3.  Follow up in 3 months.

## 2017-06-13 NOTE — Progress Notes (Signed)
NEUROLOGY FOLLOW UP OFFICE NOTE  Savannah White [id number]  HISTORY OF PRESENT ILLNESS: Savannah White is a 41 year old female who follows up for migraine.  UPDATE: Headaches became daily about 2 months ago.  They are also now bilateral and feels soreness in back of head and upper neck.  She reports increased pain when extending neck back. Intensity:  8/10 Duration:  Starts in afternoon and resolves after 20-30 minutes with sumatriptan or ibuprofen Frequency:  daily Frequency of abortive medication: daily Current NSAIDS:  ibuprofen Current analgesics:  no Current triptans:  sumatriptan 100mg  (does not like it as it makes her feel "funny".  She reports nausea and sensation in her throat). Current anti-emetic:  no Current muscle relaxants:  no Current anti-anxiolytic:  no Current sleep aide:  no Current Antihypertensive medications:  no Current Antidepressant medications:  no Current Anticonvulsant medications:  no Current Vitamins/Herbal/Supplements:  magnesium, riboflavin, CoQ10 Current Antihistamines/Decongestants:  diphenhydramine 10mg  Other therapy:  no Birth Control:  IUD (placed 2016 but headaches precede this)   Caffeine:  no Alcohol:  no Smoker:  no Diet:  2 bottles of water daily Exercise:  Walks daily Depression/anxiety:  no Sleep hygiene:  good  HISTORY: Onset:  Several years ago Location:  Unilateral, either side (involving head, face, periorbital) Quality:  Pressure.  Head feels tender afterwards. Initial intensity:  8/10 Aura:  no Prodrome:  No Associated symptoms:  No nausea, vomiting, photophobia, phonophobia or autonomic symptoms..  She has not had any new worse headache of her life, waking up from sleep Initial Duration:  1 day Initial Frequency:  Varies.  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  MEDICATIONS: Current Outpatient Prescriptions on File Prior to Visit  Medication Sig Dispense Refill  . ibuprofen (ADVIL,MOTRIN) 200 MG tablet Take 200 mg by mouth every 6 (six) hours as needed.     No current facility-administered medications on file prior to visit.     ALLERGIES: No Known Allergies  FAMILY HISTORY: Family History  Problem Relation Age of Onset  . Family history unknown: Yes    SOCIAL HISTORY: Social History   Social History  . Marital status: Married    Spouse name: N/A  . Number of children: N/A  . Years of education: N/A   Occupational History  . Not on file.   Social History Main Topics  . Smoking status: Never Smoker  . Smokeless tobacco: Never Used  . Alcohol use No  . Drug use: No  . Sexual activity: Not on file   Other Topics Concern  . Not on file   Social History Narrative  . No narrative on file    REVIEW OF SYSTEMS: Constitutional: No fevers, chills, or sweats, no generalized fatigue, change in appetite Eyes: No visual changes, double vision, eye pain  Ear, nose and throat: No hearing loss, ear pain, nasal congestion, sore throat Cardiovascular: No chest pain, palpitations Respiratory:  No shortness of breath at rest or with exertion, wheezes GastrointestinaI: No nausea, vomiting, diarrhea, abdominal pain, fecal incontinence Genitourinary:  No  dysuria, urinary retention or frequency Musculoskeletal:  No neck pain, back pain Integumentary: No rash, pruritus, skin lesions Neurological: as above Psychiatric: No depression, insomnia, anxiety Endocrine: No palpitations, fatigue, diaphoresis, mood swings, change in appetite, change in weight, increased thirst Hematologic/Lymphatic:  No purpura, petechiae. Allergic/Immunologic: no itchy/runny eyes, nasal congestion, recent allergic reactions, rashes  PHYSICAL EXAM: Vitals:   06/13/17 0742  BP: 98/64  Pulse: 64  SpO2: 98%   General: No acute distress.  Patient appears well-groomed.  normal body habitus. Head:  Normocephalic/atraumatic Eyes:  Fundi examined but not visualized Neck: supple, no paraspinal tenderness, full range of motion Heart:  Regular rate and rhythm Lungs:  Clear to auscultation bilaterally Back: No paraspinal tenderness Neurological Exam: alert and oriented to person, place, and time. Attention span and concentration intact, recent and remote memory intact, fund of knowledge intact.  Speech fluent and not dysarthric, language intact.  CN II-XII intact. Bulk and tone normal, muscle strength 5/5 throughout.  Sensation to light touch, temperature and vibration intact.  Deep tendon reflexes 2+ throughout, toes downgoing.  Finger to nose and heel to shin testing intact.  Gait normal, Romberg negative.  IMPRESSION: Migraine without aura  PLAN: 1.  Start topiramate 50mg  at bedtime.  Side effects discussed.  Informed her not to get pregnant due to teratogenic effects.  Advised to contact us in 4 weeks if we need to increase dose. 2.  Stop sumatriptan.  Continue ibuprofen but limited to no more than 2 days out of the week to prevent rebound headache. 3.  Follow up in 3 months.  Shon MilletAdam Jaffe, DO  CC:  Hillard DankerElizabeth Crawford, MD

## 2017-07-04 ENCOUNTER — Ambulatory Visit: Payer: BLUE CROSS/BLUE SHIELD | Admitting: Neurology

## 2017-09-23 ENCOUNTER — Ambulatory Visit: Payer: BLUE CROSS/BLUE SHIELD | Admitting: Neurology

## 2017-10-21 ENCOUNTER — Ambulatory Visit: Payer: BLUE CROSS/BLUE SHIELD | Admitting: Neurology

## 2019-08-12 ENCOUNTER — Other Ambulatory Visit: Payer: Self-pay

## 2019-08-12 DIAGNOSIS — Z20822 Contact with and (suspected) exposure to covid-19: Secondary | ICD-10-CM

## 2019-08-13 LAB — NOVEL CORONAVIRUS, NAA: SARS-CoV-2, NAA: NOT DETECTED
